# Patient Record
Sex: Male | Born: 1995 | Race: Black or African American | Hispanic: No | Marital: Single
Health system: Midwestern US, Community
[De-identification: ages and names within clinical notes are randomized; demographics above are authoritative.]

## PROBLEM LIST (undated history)

## (undated) DIAGNOSIS — J45909 Unspecified asthma, uncomplicated: Secondary | ICD-10-CM

---

## 2016-09-10 ENCOUNTER — Encounter (HOSPITAL_COMMUNITY): Payer: Self-pay | Admitting: Emergency Medicine

## 2016-09-10 ENCOUNTER — Emergency Department (HOSPITAL_COMMUNITY): Payer: Self-pay

## 2016-09-10 ENCOUNTER — Emergency Department (HOSPITAL_COMMUNITY)
Admission: EM | Admit: 2016-09-10 | Discharge: 2016-09-10 | Disposition: A | Discharge status: Home / Self Care | Payer: Self-pay | Attending: Emergency Medicine | Admitting: Emergency Medicine

## 2016-09-10 DIAGNOSIS — F17200 Nicotine dependence, unspecified, uncomplicated: Secondary | ICD-10-CM | POA: Insufficient documentation

## 2016-09-10 DIAGNOSIS — Z79899 Other long term (current) drug therapy: Secondary | ICD-10-CM | POA: Insufficient documentation

## 2016-09-10 DIAGNOSIS — J9801 Acute bronchospasm: Secondary | ICD-10-CM | POA: Insufficient documentation

## 2016-09-10 HISTORY — DX: Unspecified asthma, uncomplicated: J45.909

## 2016-09-10 LAB — CBC
HEMATOCRIT: 40 % (ref 39.0–52.0)
HEMOGLOBIN: 14.1 g/dL (ref 13.0–17.0)
MCH: 29.6 pg (ref 26.0–34.0)
MCHC: 35.3 g/dL (ref 30.0–36.0)
MCV: 83.9 fL (ref 78.0–100.0)
Platelets: 229 10*3/uL (ref 150–400)
RBC: 4.77 MIL/uL (ref 4.22–5.81)
RDW: 12.8 % (ref 11.5–15.5)
WBC: 7.3 10*3/uL (ref 4.0–10.5)

## 2016-09-10 LAB — BASIC METABOLIC PANEL
ANION GAP: 7 (ref 5–15)
BUN: 6 mg/dL (ref 6–20)
CALCIUM: 9.5 mg/dL (ref 8.9–10.3)
CO2: 27 mmol/L (ref 22–32)
Chloride: 106 mmol/L (ref 101–111)
Creatinine, Ser: 1.1 mg/dL (ref 0.61–1.24)
GFR calc non Af Amer: >60 mL/min (ref >60)
Glucose, Bld: 89 mg/dL (ref 65–99)
POTASSIUM: 3.8 mmol/L (ref 3.5–5.1)
Sodium: 140 mmol/L (ref 135–145)

## 2016-09-10 LAB — I-STAT TROPONIN, ED: TROPONIN I, POC: 0 ng/mL (ref 0.00–0.08)

## 2016-09-10 LAB — D-DIMER, QUANTITATIVE: D-Dimer, Quant: <0.27 ug/mL-FEU (ref 0.00–0.50)

## 2016-09-10 MED ORDER — PREDNISONE 20 MG PO TABS
60.0000 mg | ORAL_TABLET | Freq: Once | ORAL | Status: AC
  Administered 2016-09-10: 60 mg via ORAL
  Filled 2016-09-10: qty 3

## 2016-09-10 MED ORDER — ALBUTEROL SULFATE HFA 108 (90 BASE) MCG/ACT IN AERS
1.0000 | INHALATION_SPRAY | RESPIRATORY_TRACT | Status: DC | PRN
  Administered 2016-09-10: 1 via RESPIRATORY_TRACT
  Filled 2016-09-10: qty 6.7

## 2016-09-10 MED ORDER — KETOROLAC TROMETHAMINE 60 MG/2ML IM SOLN
60.0000 mg | Freq: Once | INTRAMUSCULAR | Status: AC
  Administered 2016-09-10: 60 mg via INTRAMUSCULAR

## 2016-09-10 MED ORDER — AEROCHAMBER PLUS FLO-VU MEDIUM MISC
1.0000 | Freq: Once | Status: AC
  Administered 2016-09-10: 1
  Filled 2016-09-10: qty 1

## 2016-09-10 MED ORDER — PREDNISONE 20 MG PO TABS: 40.0000 mg | ORAL_TABLET | Freq: Every day | ORAL | 0 refills | Status: DC

## 2016-09-10 MED ORDER — IPRATROPIUM-ALBUTEROL 0.5-2.5 (3) MG/3ML IN SOLN
3.0000 mL | Freq: Once | RESPIRATORY_TRACT | Status: AC
  Administered 2016-09-10: 3 mL via RESPIRATORY_TRACT
  Filled 2016-09-10: qty 3

## 2016-09-10 MED ORDER — KETOROLAC TROMETHAMINE 60 MG/2ML IM SOLN
INTRAMUSCULAR | Status: AC
  Filled 2016-09-10: qty 2

## 2016-09-10 NOTE — ED Provider Notes (Signed)
[facility]-EMERGENCY DEPT Provider Note   CSN: [id number] Arrival date & time: 09/10/16  1050     History   Chief Complaint Chief Complaint  Patient presents with  . Cough  . Chest Pain  . Shortness of Breath    HPI Mike Ramirez is a 21 y.o. male.  HPI Mike Ramirez is a 21 y.o. male with PMH significant for asthma who presents with sudden onset, constant, non-radiating, sharp, stabbing, left sided chest pain x 2 days that is worse with deep inspiration with associated shortness of breath.  No fever, chills, cough, syncope, dizziness, lightheadedness, N/V, or abdominal pain.  He has not taken anything for his symptoms.  It is not exertional.  He denies hx of DVT/PE, recent surgery, unilateral leg edema, or exogenous hormone use.  He does note a recent 13 hour bus ride 3 days ago.    Past Medical History:  Diagnosis Date  . Asthma     There are no active problems to display for this patient.   History reviewed. No pertinent surgical history.     Home Medications    Prior to Admission medications   Medication Sig Start Date End Date Taking? Authorizing Provider  albuterol (PROVENTIL HFA;VENTOLIN HFA) 108 (90 Base) MCG/ACT inhaler Inhale 1-2 puffs into the lungs every 6 (six) hours as needed for wheezing or shortness of breath.   Yes Historical Provider, MD  predniSONE (DELTASONE) 20 MG tablet Take 2 tablets (40 mg total) by mouth daily. 09/10/16   Cheri FowlerKayla Presten Joost, PA-C    Family History History reviewed. No pertinent family history.  Social History Social History  Substance Use Topics  . Smoking status: Current Some Day Smoker  . Smokeless tobacco: Never Used  . Alcohol use No     Allergies   Patient has no known allergies.   Review of Systems Review of Systems All other systems negative unless otherwise stated in HPI   Physical Exam Updated Vital Signs BP 139/79 (BP Location: Right Arm)   Pulse 95   Temp 98.4 F (36.9 C) (Oral)   Resp 20   Ht 5\' 9"  (1.753  m)   Wt 77.1 kg   SpO2 98%   BMI 25.10 kg/m   Physical Exam  Constitutional: He is oriented to person, place, and time. He appears well-developed and well-nourished.  Non-toxic appearance. He does not have a sickly appearance. He does not appear ill.  HENT:  Head: Normocephalic and atraumatic.  Mouth/Throat: Oropharynx is clear and moist.  Eyes: Conjunctivae are normal. Pupils are equal, round, and reactive to light.  Neck: Normal range of motion. Neck supple.  Cardiovascular: Normal rate, regular rhythm and normal heart sounds.   Pulmonary/Chest: Effort normal. No accessory muscle usage or stridor. No respiratory distress. He has wheezes. He has no rhonchi. He has no rales. He exhibits no tenderness.  Abdominal: Soft. Bowel sounds are normal. He exhibits no distension. There is no tenderness. There is no rebound and no guarding.  Musculoskeletal: Normal range of motion.  Lymphadenopathy:    He has no cervical adenopathy.  Neurological: He is alert and oriented to person, place, and time.  Speech clear without dysarthria.  Skin: Skin is warm and dry.  Psychiatric: He has a normal mood and affect. His behavior is normal.     ED Treatments / Results  Labs (all labs ordered are listed, but only abnormal results are displayed) Labs Reviewed  BASIC METABOLIC PANEL  CBC  D-DIMER, QUANTITATIVE (NOT [facility])  I-STAT  TROPOININ, ED    EKG  EKG Interpretation  Date/Time:  Tuesday September 10 2016 11:17:03 EST Ventricular Rate:  84 PR Interval:    QRS Duration: 81 QT Interval:  339 QTC Calculation: 401 R Axis:   84 Text Interpretation:  Sinus arrhythmia Baseline wander in lead(s) II No significant change since last tracing Confirmed by Anitra Lauth  MD, Alphonzo Lemmings ([id number]) on 09/10/2016 2:59:49 PM       Radiology Dg Chest 2 View  Result Date: 09/10/2016 CLINICAL DATA:  Cough, congestion, and left-sided chest pain for 2 days. EXAM: CHEST  2 VIEW COMPARISON:  None. FINDINGS: The  cardiomediastinal silhouette is within normal limits. The lungs are well inflated and clear. There is no evidence of pleural effusion or pneumothorax. No acute osseous abnormality is identified. IMPRESSION: No active cardiopulmonary disease. Electronically Signed   By: Sebastian Ache M.D.   On: 09/10/2016 11:55    Procedures Procedures (including critical care time)  Medications Ordered in ED Medications  albuterol (PROVENTIL HFA;VENTOLIN HFA) 108 (90 Base) MCG/ACT inhaler 1-2 puff (1 puff Inhalation Given 09/10/16 1515)  ketorolac (TORADOL) injection 60 mg (60 mg Intramuscular Given 09/10/16 1404)  ipratropium-albuterol (DUONEB) 0.5-2.5 (3) MG/3ML nebulizer solution 3 mL (3 mLs Nebulization Given 09/10/16 1418)  predniSONE (DELTASONE) tablet 60 mg (60 mg Oral Given 09/10/16 1418)  AEROCHAMBER PLUS FLO-VU MEDIUM MISC 1 each (1 each Other Given 09/10/16 1515)     Initial Impression / Assessment and Plan / ED Course  I have reviewed the triage vital signs and the nursing notes.  Pertinent labs & imaging results that were available during my care of the patient were reviewed by me and considered in my medical decision making (see chart for details).     Patient presents with likely bronchospasm.  Vitals stable.  Hx of asthma and similar to prior.  Lungs with faint wheezing.  No respiratory distress.  CP is not exertional.  Not reproducible. EKG without ischemia.  Labs, including dimer and troponin without acute abnormalities.  Single troponin sufficient given 3 day history of constant pain.  CXR clear.  HEAR score 1.  Low suspicion for ACS or dissection.  Likely bronchospasm.  Feels improved after Toradol, prednisone, and duoneb.  Follow up with [facility] given.  Return precautions discussed.  Stable for discharge.   Final Clinical Impressions(s) / ED Diagnoses   Final diagnoses:  Bronchospasm    New Prescriptions New Prescriptions   PREDNISONE (DELTASONE) 20 MG TABLET    Take 2 tablets (40 mg  total) by mouth daily.         Cheri Fowler, PA-C 09/10/16 1522    Gwyneth Sprout, MD 09/11/16 2136

## 2016-09-10 NOTE — ED Notes (Signed)
PT DISCHARGED. INSTRUCTIONS AND PRESCRIPTION GIVEN. AAOX4. PT IN NO APPARENT DISTRESS OR PAIN. THE OPPORTUNITY TO ASK QUESTIONS WAS PROVIDED. 

## 2016-09-10 NOTE — ED Triage Notes (Signed)
Pt c/o cough, sharp lt sided CP, and SOB x 2 days.  No other sx.

## 2016-09-10 NOTE — Discharge Instructions (Signed)
Please take prednisone for the next 4 days and use the inhaler every 4 hours as needed for shortness of breath.  You may take 800 mg ibuprofen for pain.  [facility] to establish a primary care physician.  Return to the ED for any new or concerning symptoms.

## 2016-10-24 ENCOUNTER — Encounter (HOSPITAL_COMMUNITY): Payer: Self-pay | Admitting: Emergency Medicine

## 2016-10-24 ENCOUNTER — Emergency Department (HOSPITAL_COMMUNITY)
Admission: EM | Admit: 2016-10-24 | Discharge: 2016-10-24 | Disposition: A | Discharge status: Home / Self Care | Payer: PRIVATE HEALTH INSURANCE | Attending: Emergency Medicine | Admitting: Emergency Medicine

## 2016-10-24 DIAGNOSIS — J4521 Mild intermittent asthma with (acute) exacerbation: Secondary | ICD-10-CM | POA: Insufficient documentation

## 2016-10-24 DIAGNOSIS — F17200 Nicotine dependence, unspecified, uncomplicated: Secondary | ICD-10-CM | POA: Insufficient documentation

## 2016-10-24 MED ORDER — ALBUTEROL SULFATE HFA 108 (90 BASE) MCG/ACT IN AERS
1.0000 | INHALATION_SPRAY | Freq: Once | RESPIRATORY_TRACT | Status: AC
  Administered 2016-10-24: 1 via RESPIRATORY_TRACT
  Filled 2016-10-24: qty 6.7

## 2016-10-24 NOTE — ED Triage Notes (Signed)
Pt reports hx of asthma states he ran out of his medications, started having productive cough and "bronchospasms" yesterday. Reports some sob. Ambulatory to triage, speaking in full sentences, resp e/u, nad. Lung sounds clear.

## 2016-10-24 NOTE — Discharge Instructions (Signed)
I have refilled your albuterol inhaler.  It is important to have a primary care provider to manage your asthma.  Return to ER for new or worsening symptoms, any additional concerns.

## 2016-10-24 NOTE — ED Provider Notes (Signed)
[facility]-EMERGENCY DEPT Provider Note   CSN: [id number] Arrival date & time: 10/24/16  0102  By signing my name below, I, Majel Homer, attest that this documentation has been prepared under the direction and in the presence of [facility], PA-C . Electronically Signed: Majel Homer, Scribe. 10/24/2016. 10:29 AM.  History   Chief Complaint Chief Complaint  Patient presents with  . Cough  . Asthma   The history is provided by the patient. No language interpreter was used.   HPI Comments: Mike Ramirez is a 21 y.o. male with PMHx of asthma, who presents to the Emergency Department complaining of persistent, shortness of breath consistent with his exacerbations of asthma that began yesterday. Pt reports associated cough productive of "a little mucous" that also began yesterday. He states he normally uses his albuterol inhaler every day to treat his asthma but notes he ran out of this medication ~1 week ago. He denies any nasal congestion, chest pain, sore throat or fever/chills.    Past Medical History:  Diagnosis Date  . Asthma    There are no active problems to display for this patient.  History reviewed. No pertinent surgical history.  Home Medications    Prior to Admission medications   Medication Sig Start Date End Date Taking? Authorizing Provider  albuterol (PROVENTIL HFA;VENTOLIN HFA) 108 (90 Base) MCG/ACT inhaler Inhale 1-2 puffs into the lungs every 6 (six) hours as needed for wheezing or shortness of breath.    Historical Provider, MD  predniSONE (DELTASONE) 20 MG tablet Take 2 tablets (40 mg total) by mouth daily. 09/10/16   Cheri Fowler, PA-C    Family History No family history on file.  Social History Social History  Substance Use Topics  . Smoking status: Current Some Day Smoker  . Smokeless tobacco: Never Used  . Alcohol use No   Allergies   Patient has no known allergies.  Review of Systems Review of Systems  Constitutional: Negative for chills and fever.  HENT:  Negative for congestion and sore throat.   Respiratory: Positive for cough and shortness of breath.   Cardiovascular: Negative for chest pain.   Physical Exam Updated Vital Signs BP 145/62   Pulse 97   Temp 97.7 F (36.5 C) (Oral)   Resp 15   SpO2 100%   Physical Exam  Constitutional: He is oriented to person, place, and time. He appears well-developed and well-nourished. No distress.  HENT:  Head: Normocephalic and atraumatic.  Neck: Normal range of motion. Neck supple.  Cardiovascular: Normal rate, regular rhythm and normal heart sounds.   Pulmonary/Chest: Effort normal and breath sounds normal.  Lungs clear to auscultation bilaterally - no wheezes, rales or rhonchi. Speaking in full sentences without difficulty.  Abdominal: He exhibits distension.  Musculoskeletal: Normal range of motion.  Neurological: He is alert and oriented to person, place, and time.  Psychiatric: He has a normal mood and affect.  Nursing note and vitals reviewed.  ED Treatments / Results  DIAGNOSTIC STUDIES:  Oxygen Saturation is 100% on RA, normal by my interpretation.    COORDINATION OF CARE:  10:16 AM Discussed treatment plan with pt at bedside and pt agreed to plan.  Labs (all labs ordered are listed, but only abnormal results are displayed) Labs Reviewed - No data to display  EKG  EKG Interpretation None       Radiology No results found.  Procedures Procedures (including critical care time)  Medications Ordered in ED Medications  albuterol (PROVENTIL HFA;VENTOLIN HFA) 108 (90  Base) MCG/ACT inhaler 1-2 puff (1 puff Inhalation Given 10/24/16 1028)    Initial Impression / Assessment and Plan / ED Course  I have reviewed the triage vital signs and the nursing notes.  Pertinent labs & imaging results that were available during my care of the patient were reviewed by me and considered in my medical decision making (see chart for details).     Mike Ramirez is a 21 y.o. male who  presents to ED for intermittent shortness of breath c/w asthma sxs. He typically uses his albuterol inhaler daily for his asthma, however has run out and is requesting albuterol inhaler refill. On exam, patient is afebrile, well-appearing and speaking in full sentences without difficulty. O2 sats of 100% on room air. Lungs are clear to auscultation with no wheezes, rales or rhonchi. Albuterol inhaler provided. Strongly encouraged patient to find a primary care provider in the area. He states that once he starts his new job and has insurance he is going to do so. Reasons to return to the ER were discussed and all questions answered.  I personally performed the services described in this documentation, which was scribed in my presence. The recorded information has been reviewed and is accurate.   Final Clinical Impressions(s) / ED Diagnoses   Final diagnoses:  Mild intermittent asthma with exacerbation    New Prescriptions Discharge Medication List as of 10/24/2016 10:20 AM       Chase Picket Fatisha Rabalais, PA-C 10/24/16 1029    Vanetta Mulders, MD 10/25/16 1104

## 2016-11-20 ENCOUNTER — Encounter (HOSPITAL_COMMUNITY): Payer: Self-pay

## 2016-11-20 ENCOUNTER — Emergency Department (HOSPITAL_COMMUNITY): Payer: PRIVATE HEALTH INSURANCE

## 2016-11-20 ENCOUNTER — Emergency Department (HOSPITAL_COMMUNITY)
Admission: EM | Admit: 2016-11-20 | Discharge: 2016-11-20 | Disposition: A | Discharge status: Home / Self Care | Payer: PRIVATE HEALTH INSURANCE | Attending: Emergency Medicine | Admitting: Emergency Medicine

## 2016-11-20 DIAGNOSIS — F17200 Nicotine dependence, unspecified, uncomplicated: Secondary | ICD-10-CM | POA: Insufficient documentation

## 2016-11-20 DIAGNOSIS — J4541 Moderate persistent asthma with (acute) exacerbation: Secondary | ICD-10-CM | POA: Insufficient documentation

## 2016-11-20 LAB — BASIC METABOLIC PANEL
ANION GAP: 11 (ref 5–15)
BUN: 11 mg/dL (ref 6–20)
CALCIUM: 9.7 mg/dL (ref 8.9–10.3)
CO2: 22 mmol/L (ref 22–32)
CREATININE: 1.24 mg/dL (ref 0.61–1.24)
Chloride: 106 mmol/L (ref 101–111)
GFR calc Af Amer: >60 mL/min (ref >60)
GFR calc non Af Amer: >60 mL/min (ref >60)
GLUCOSE: 92 mg/dL (ref 65–99)
Potassium: 3.8 mmol/L (ref 3.5–5.1)
Sodium: 139 mmol/L (ref 135–145)

## 2016-11-20 LAB — CBC
HCT: 39.7 % (ref 39.0–52.0)
HEMOGLOBIN: 14 g/dL (ref 13.0–17.0)
MCH: 30.2 pg (ref 26.0–34.0)
MCHC: 35.3 g/dL (ref 30.0–36.0)
MCV: 85.7 fL (ref 78.0–100.0)
Platelets: 269 10*3/uL (ref 150–400)
RBC: 4.63 MIL/uL (ref 4.22–5.81)
RDW: 13.6 % (ref 11.5–15.5)
WBC: 9.5 10*3/uL (ref 4.0–10.5)

## 2016-11-20 LAB — I-STAT TROPONIN, ED: TROPONIN I, POC: 0 ng/mL (ref 0.00–0.08)

## 2016-11-20 MED ORDER — PREDNISONE 20 MG PO TABS
60.0000 mg | ORAL_TABLET | Freq: Once | ORAL | Status: AC
  Administered 2016-11-20: 60 mg via ORAL
  Filled 2016-11-20: qty 3

## 2016-11-20 MED ORDER — PREDNISONE 10 MG PO TABS: 20.0000 mg | ORAL_TABLET | Freq: Every day | ORAL | 0 refills | Status: DC

## 2016-11-20 MED ORDER — ALBUTEROL SULFATE HFA 108 (90 BASE) MCG/ACT IN AERS
2.0000 | INHALATION_SPRAY | RESPIRATORY_TRACT | Status: DC | PRN
  Administered 2016-11-20: 2 via RESPIRATORY_TRACT
  Filled 2016-11-20: qty 6.7

## 2016-11-20 NOTE — ED Notes (Signed)
Patient left at this time with all belongings. 

## 2016-11-20 NOTE — ED Provider Notes (Signed)
[facility]-EMERGENCY DEPT Provider Note   CSN: [id number] Arrival date & time: 11/20/16  2109     History   Chief Complaint Chief Complaint  Patient presents with  . Asthma  . Chest Pain    HPI Mike Ramirez is a 21 y.o. male.  This a 22 year old male with a history of asthma.  He's had to use his inhaler more frequently over the last several weeks, up to 8 times a day.  He is currently out of his inhaler.  He has no primary care physician. Denies any fever, nonproductive cough.      Past Medical History:  Diagnosis Date  . Asthma     There are no active problems to display for this patient.   History reviewed. No pertinent surgical history.     Home Medications    Prior to Admission medications   Medication Sig Start Date End Date Taking? Authorizing Provider  albuterol (PROVENTIL HFA;VENTOLIN HFA) 108 (90 Base) MCG/ACT inhaler Inhale 1-2 puffs into the lungs every 6 (six) hours as needed for wheezing or shortness of breath.    Historical Provider, MD  predniSONE (DELTASONE) 10 MG tablet Take 2 tablets (20 mg total) by mouth daily. 11/20/16   Earley Favor, NP  predniSONE (DELTASONE) 20 MG tablet Take 2 tablets (40 mg total) by mouth daily. 09/10/16   Cheri Fowler, PA-C    Family History No family history on file.  Social History Social History  Substance Use Topics  . Smoking status: Current Some Day Smoker  . Smokeless tobacco: Never Used  . Alcohol use No     Allergies   Patient has no known allergies.   Review of Systems Review of Systems  Constitutional: Negative for fever.  HENT: Negative for congestion and rhinorrhea.   Respiratory: Positive for shortness of breath and wheezing. Negative for cough.   Cardiovascular: Positive for chest pain.  All other systems reviewed and are negative.    Physical Exam Updated Vital Signs BP 128/75 (BP Location: Right Arm)   Pulse (!) 102   Temp 98 F (36.7 C) (Oral)   Resp 18   Ht  (1.727 m)   Wt  81.6 kg   SpO2 99%   BMI 27.37 kg/m   Physical Exam  Constitutional: He appears well-developed and well-nourished.  HENT:  Head: Normocephalic.  Eyes: Pupils are equal, round, and reactive to light.  Neck: Normal range of motion.  Cardiovascular: Normal rate.   Pulmonary/Chest: Effort normal. He has wheezes. He exhibits no tenderness.  Slight end expiratory wheeze  Abdominal: Soft.  Musculoskeletal: Normal range of motion.  Neurological: He is alert.  Skin: Skin is warm.  Psychiatric: He has a normal mood and affect.  Nursing note and vitals reviewed.    ED Treatments / Results  Labs (all labs ordered are listed, but only abnormal results are displayed) Labs Reviewed  BASIC METABOLIC PANEL  CBC  I-STAT TROPOININ, ED    EKG  EKG Interpretation None       Radiology Dg Chest 2 View  Result Date: 11/20/2016 CLINICAL DATA:  21 y/o  M; chest pain. EXAM: CHEST  2 VIEW COMPARISON:  09/10/2016 chest radiograph FINDINGS: Stable heart size and mediastinal contours are within normal limits. Both lungs are clear. The visualized skeletal structures are unremarkable. IMPRESSION: No active cardiopulmonary disease. Electronically Signed   By: Mitzi Hansen M.D.   On: 11/20/2016 22:01    Procedures Procedures (including critical care time)  Medications Ordered in ED  Medications  predniSONE (DELTASONE) tablet 60 mg (not administered)  albuterol (PROVENTIL HFA;VENTOLIN HFA) 108 (90 Base) MCG/ACT inhaler 2 puff (not administered)     Initial Impression / Assessment and Plan / ED Course  I have reviewed the triage vital signs and the nursing notes.  Pertinent labs & imaging results that were available during my care of the patient were reviewed by me and considered in my medical decision making (see chart for details).      Discussed with the patient the importance of a primary care physician is a think his asthma has been from mild to intermediate, he will be  given a short course of steroids will be started in the emergency department, as well as an inhaler and referral to community wellness.  He's been instructed and informed of signs and symptoms to bring him back to the emergency department immediately.  Final Clinical Impressions(s) / ED Diagnoses   Final diagnoses:  Moderate persistent asthma with exacerbation    New Prescriptions New Prescriptions   PREDNISONE (DELTASONE) 10 MG TABLET    Take 2 tablets (20 mg total) by mouth daily.     Earley Favor, NP 11/20/16 2312    Earley Favor, NP 11/20/16 2313    Lavera Guise, MD 11/22/16 [phone]

## 2016-11-20 NOTE — ED Triage Notes (Signed)
Pt states that he has asthma ans he is out of his albuterol, becomes more SOB when he starts working. Pt reports non productive cough and denies fevers. Pt also reports constant CP for the past few days, L sided, denies n/v

## 2016-11-20 NOTE — Discharge Instructions (Signed)
As discussed.  It is very important to establish primary medical care.  You have been given a referral to community wellness.  Please all to set an appointment Been started on a short course of steroids, i.e., prednisone.  Please use until all tablets have been completed.  You've also been provided with an inhaler.  Try not to use it more than 2 puffs every 4 hours As discussed if you need to use her inhaler more frequently if your shortness of breath becomes worse despite the use of your inhaler, you need to come back to the emergency department for further evaluation. Also be helpful prior to your appointment to keep track of how often you're using your inhaler so they can better ascertain the level of asthma, your this time.

## 2016-12-13 ENCOUNTER — Encounter (HOSPITAL_COMMUNITY): Payer: Self-pay | Admitting: Emergency Medicine

## 2016-12-13 ENCOUNTER — Emergency Department (HOSPITAL_COMMUNITY)
Admission: EM | Admit: 2016-12-13 | Discharge: 2016-12-13 | Disposition: A | Discharge status: Home / Self Care | Payer: PRIVATE HEALTH INSURANCE | Attending: Emergency Medicine | Admitting: Emergency Medicine

## 2016-12-13 DIAGNOSIS — F17210 Nicotine dependence, cigarettes, uncomplicated: Secondary | ICD-10-CM | POA: Insufficient documentation

## 2016-12-13 DIAGNOSIS — J4541 Moderate persistent asthma with (acute) exacerbation: Secondary | ICD-10-CM | POA: Insufficient documentation

## 2016-12-13 MED ORDER — PREDNISONE 50 MG PO TABS: 50.0000 mg | ORAL_TABLET | Freq: Every day | ORAL | 0 refills | Status: AC

## 2016-12-13 MED ORDER — CETIRIZINE HCL 10 MG PO TABS: 10.0000 mg | ORAL_TABLET | Freq: Every day | ORAL | 0 refills | Status: DC

## 2016-12-13 MED ORDER — ALBUTEROL SULFATE (2.5 MG/3ML) 0.083% IN NEBU
5.0000 mg | INHALATION_SOLUTION | Freq: Once | RESPIRATORY_TRACT | Status: AC
  Administered 2016-12-13: 5 mg via RESPIRATORY_TRACT
  Filled 2016-12-13: qty 6

## 2016-12-13 MED ORDER — PREDNISONE 20 MG PO TABS
60.0000 mg | ORAL_TABLET | Freq: Once | ORAL | Status: AC
  Administered 2016-12-13: 60 mg via ORAL
  Filled 2016-12-13: qty 3

## 2016-12-13 MED ORDER — ALBUTEROL SULFATE HFA 108 (90 BASE) MCG/ACT IN AERS: 1.0000 | INHALATION_SPRAY | Freq: Four times a day (QID) | RESPIRATORY_TRACT | 0 refills | Status: DC | PRN

## 2016-12-13 NOTE — Discharge Instructions (Signed)
Medications: Zyrtec, prednisone, albuterol inhaler  Treatment: Take Zyrtec once daily for allergy symptoms as cause of potential asthma exacerbations. Take prednisone once daily for 4 more days. Use albuterol inhaler every 4-6 hours as needed for wheezing or shortness of breath.  Follow-up: Please follow-up at your scheduled appointment next week to establish care with a primary care provider. Please return to the emergency department if you develop any new or worsening symptoms.

## 2016-12-13 NOTE — ED Notes (Signed)
ED Provider at bedside. 

## 2016-12-13 NOTE — ED Provider Notes (Signed)
[facility]-EMERGENCY DEPT Provider Note   CSN: [id number] Arrival date & time: 12/13/16  0759     History   Chief Complaint Chief Complaint  Patient presents with  . Asthma    HPI Mike Ramirez is a 21 y.o. male with history of asthma who presents with shortness of breath and wheezing. Patient states he has had to use his inhaler 5-6 times a day recently. He has associated cough when he needs to use his inhaler. Patient denies any chest pain. He denies any fevers, abdominal pain, nausea and vomiting. Patient has an appointment to establish care with a primary care provider on 12/20/2016.  HPI  Past Medical History:  Diagnosis Date  . Asthma     There are no active problems to display for this patient.   History reviewed. No pertinent surgical history.     Home Medications    Prior to Admission medications   Medication Sig Start Date End Date Taking? Authorizing Provider  albuterol (PROVENTIL HFA;VENTOLIN HFA) 108 (90 Base) MCG/ACT inhaler Inhale 1-2 puffs into the lungs every 6 (six) hours as needed for wheezing or shortness of breath. 12/13/16   Emi HolesAlexandra M Kaity Pitstick, PA-C  cetirizine (ZYRTEC ALLERGY) 10 MG tablet Take 1 tablet (10 mg total) by mouth daily. 12/13/16   Emi HolesAlexandra M Neil Brickell, PA-C  predniSONE (DELTASONE) 50 MG tablet Take 1 tablet (50 mg total) by mouth daily. 12/13/16   Emi HolesAlexandra M Rooney Swails, PA-C    Family History No family history on file.  Social History Social History  Substance Use Topics  . Smoking status: Current Every Day Smoker    Packs/day: 0.20    Types: Cigarettes, Cigars  . Smokeless tobacco: Never Used  . Alcohol use No     Allergies   Patient has no known allergies.   Review of Systems Review of Systems  Constitutional: Negative for fever.  HENT: Negative for facial swelling and sore throat.   Respiratory: Positive for cough, shortness of breath and wheezing.   Cardiovascular: Negative for chest pain.  Gastrointestinal: Negative for abdominal pain,  nausea and vomiting.  Skin: Negative for rash and wound.     Physical Exam Updated Vital Signs BP (!) 147/79   Pulse 92   Temp 98.2 F (36.8 C) (Oral)   Resp 18   Ht 5\' 8"  (1.727 m)   Wt 81.6 kg   SpO2 98%   BMI 27.37 kg/m   Physical Exam  Constitutional: He appears well-developed and well-nourished. No distress.  HENT:  Head: Normocephalic and atraumatic.  Mouth/Throat: Oropharynx is clear and moist. No oropharyngeal exudate.  Eyes: Conjunctivae are normal. Pupils are equal, round, and reactive to light. Right eye exhibits no discharge. Left eye exhibits no discharge. No scleral icterus.  Neck: Normal range of motion. Neck supple. No thyromegaly present.  Cardiovascular: Normal rate, regular rhythm, normal heart sounds and intact distal pulses.  Exam reveals no gallop and no friction rub.   No murmur heard. Pulmonary/Chest: Effort normal. No stridor. No respiratory distress. He has wheezes (expiratory, bilaterally). He has no rales.  Abdominal: Soft. Bowel sounds are normal. He exhibits no distension. There is no tenderness. There is no rebound and no guarding.  Musculoskeletal: He exhibits no edema.  Lymphadenopathy:    He has no cervical adenopathy.  Neurological: He is alert. Coordination normal.  Skin: Skin is warm and dry. No rash noted. He is not diaphoretic. No pallor.  Psychiatric: He has a normal mood and affect.  Nursing note and vitals  reviewed.    ED Treatments / Results  Labs (all labs ordered are listed, but only abnormal results are displayed) Labs Reviewed - No data to display  EKG  EKG Interpretation None       Radiology No results found.  Procedures Procedures (including critical care time)  Medications Ordered in ED Medications  albuterol (PROVENTIL) (2.5 MG/3ML) 0.083% nebulizer solution 5 mg (5 mg Nebulization Given 12/13/16 0857)  predniSONE (DELTASONE) tablet 60 mg (60 mg Oral Given 12/13/16 0934)     Initial Impression / Assessment  and Plan / ED Course  I have reviewed the triage vital signs and the nursing notes.  Pertinent labs & imaging results that were available during my care of the patient were reviewed by me and considered in my medical decision making (see chart for details).     Patient ambulated in ED with O2 saturations maintained >98, no current signs of respiratory distress. Lung exam improved after nebulizer treatment, wheezing resolved. Prednisone given in the ED and pt will bd dc with 5 day burst. Pt states they are breathing at baseline. Will initiate Zyrtec for allergy symptoms contributing to asthma exacerbations. Patient has appointment to follow up and establish care with PCP next week. Patient given inhaler refill. Return precautions discussed. Patient understands and agrees with plan. Patient vitals stable and discharged in satisfactory condition.  Final Clinical Impressions(s) / ED Diagnoses   Final diagnoses:  Moderate persistent asthma with exacerbation    New Prescriptions New Prescriptions   CETIRIZINE (ZYRTEC ALLERGY) 10 MG TABLET    Take 1 tablet (10 mg total) by mouth daily.     Emi Holes, PA-C 12/13/16 1610    Gerhard Munch, MD 12/17/16 [phone]

## 2016-12-13 NOTE — ED Notes (Signed)
Patient ambulating with pulse ox. Patient o2 98% HR 115 patient denies any Dizziness, Chest pain, SOB.

## 2016-12-13 NOTE — ED Triage Notes (Signed)
Pt reports ongoing wheezing with dry cough, states uses albuterol inhaler 5-6 times a day.  Pt reports being out of an inhaler at this time.

## 2016-12-20 ENCOUNTER — Ambulatory Visit (INDEPENDENT_AMBULATORY_CARE_PROVIDER_SITE_OTHER): Payer: Self-pay | Admitting: Physician Assistant

## 2016-12-20 ENCOUNTER — Encounter (INDEPENDENT_AMBULATORY_CARE_PROVIDER_SITE_OTHER): Payer: Self-pay | Admitting: Physician Assistant

## 2016-12-20 VITALS — BP 124/71 | HR 82 | Temp 97.9°F | Ht 70.0 in | Wt 190.0 lb

## 2016-12-20 DIAGNOSIS — J4521 Mild intermittent asthma with (acute) exacerbation: Secondary | ICD-10-CM

## 2016-12-20 MED ORDER — BUDESONIDE-FORMOTEROL FUMARATE 80-4.5 MCG/ACT IN AERO: 2.0000 | INHALATION_SPRAY | Freq: Two times a day (BID) | RESPIRATORY_TRACT | 3 refills | Status: AC

## 2016-12-20 MED ORDER — ALBUTEROL SULFATE HFA 108 (90 BASE) MCG/ACT IN AERS: 1.0000 | INHALATION_SPRAY | Freq: Four times a day (QID) | RESPIRATORY_TRACT | 0 refills | Status: DC | PRN

## 2016-12-20 MED ORDER — CETIRIZINE HCL 10 MG PO TABS: 10.0000 mg | ORAL_TABLET | Freq: Every day | ORAL | 0 refills | Status: AC

## 2016-12-20 NOTE — Progress Notes (Signed)
Subjective:  Patient ID: Mike Ramirez, male    DOB: 05/16/1996  Age: 21 y.o. MRN: [id number]  CC: asthma  HPI Mike Ramirez is a 21 y.o. male with a PMH of asthma presents on f/u of ED visit on 12/13/16, 11/20/16, and 10/24/16 for asthma. Has been using Albuterol few times per week. Has to use every 4-6 hours when asthma exacerbation occurs. Currently also taking Prednisone 50 mg and cetirizine. Reports asthma during spring and winter month. Also, sometimes when exercising. Does not endorse any other symptoms.       Outpatient Medications Prior to Visit  Medication Sig Dispense Refill  . predniSONE (DELTASONE) 50 MG tablet Take 1 tablet (50 mg total) by mouth daily. 4 tablet 0  . albuterol (PROVENTIL HFA;VENTOLIN HFA) 108 (90 Base) MCG/ACT inhaler Inhale 1-2 puffs into the lungs every 6 (six) hours as needed for wheezing or shortness of breath. 1 Inhaler 0  . cetirizine (ZYRTEC ALLERGY) 10 MG tablet Take 1 tablet (10 mg total) by mouth daily. 30 tablet 0   No facility-administered medications prior to visit.      ROS Review of Systems  Constitutional: Negative for chills, fever and malaise/fatigue.  Eyes: Negative for blurred vision.  Respiratory: Positive for shortness of breath. Negative for cough and wheezing.   Cardiovascular: Negative for chest pain and palpitations.  Gastrointestinal: Negative for abdominal pain and nausea.  Genitourinary: Negative for dysuria and hematuria.  Musculoskeletal: Negative for joint pain and myalgias.  Skin: Negative for rash.  Neurological: Negative for tingling and headaches.  Psychiatric/Behavioral: Negative for depression. The patient is not nervous/anxious.     Objective:  BP 124/71 (BP Location: Left Arm, Patient Position: Sitting, Cuff Size: Large)   Pulse 82   Temp 97.9 F (36.6 C) (Oral)   Ht 5\' 10"  (1.778 m)   Wt 190 lb (86.2 kg)   SpO2 98%   BMI 27.26 kg/m   BP/Weight 12/20/2016 12/13/2016 11/20/2016  Systolic BP 124 147 122   Diastolic BP 71 79 82  Wt. (Lbs) 190 180 180  BMI 27.26 27.37 27.37      Physical Exam  Constitutional: He is oriented to person, place, and time.  Well developed, well nourished, NAD, polite  HENT:  Head: Normocephalic and atraumatic.  Eyes: No scleral icterus.  Cardiovascular: Normal rate, regular rhythm and normal heart sounds.   Pulmonary/Chest: Effort normal and breath sounds normal.  Musculoskeletal: He exhibits no edema.  Neurological: He is alert and oriented to person, place, and time. No cranial nerve deficit. Coordination normal.  Skin: Skin is warm and dry. No rash noted. No erythema. No pallor.  Psychiatric: He has a normal mood and affect. His behavior is normal. Thought content normal.  Vitals reviewed.    Assessment & Plan:   1. Mild intermittent asthma with acute exacerbation - Begin budesonide-formoterol (SYMBICORT) 80-4.5 MCG/ACT inhaler; Inhale 2 puffs into the lungs 2 (two) times daily.  Dispense: 1 Inhaler; Refill: 3 - Refill albuterol (PROVENTIL HFA;VENTOLIN HFA) 108 (90 Base) MCG/ACT inhaler; Inhale 1-2 puffs into the lungs every 6 (six) hours as needed for wheezing or shortness of breath.  Dispense: 1 Inhaler; Refill: 0 - Refill cetirizine (ZYRTEC ALLERGY) 10 MG tablet; Take 1 tablet (10 mg total) by mouth daily.  Dispense: 30 tablet; Refill: 0   Meds ordered this encounter  Medications  . budesonide-formoterol (SYMBICORT) 80-4.5 MCG/ACT inhaler    Sig: Inhale 2 puffs into the lungs 2 (two) times daily.    Dispense:  1 Inhaler    Refill:  3    Order Specific Question:   Supervising Provider    Answer:   Quentin AngstJEGEDE, OLUGBEMIGA E [id number]]  . albuterol (PROVENTIL HFA;VENTOLIN HFA) 108 (90 Base) MCG/ACT inhaler    Sig: Inhale 1-2 puffs into the lungs every 6 (six) hours as needed for wheezing or shortness of breath.    Dispense:  1 Inhaler    Refill:  0    Order Specific Question:   Supervising Provider    Answer:   Quentin AngstJEGEDE, OLUGBEMIGA E [id number]]  .  cetirizine (ZYRTEC ALLERGY) 10 MG tablet    Sig: Take 1 tablet (10 mg total) by mouth daily.    Dispense:  30 tablet    Refill:  0    Order Specific Question:   Supervising Provider    Answer:   Quentin AngstJEGEDE, OLUGBEMIGA E [[id number]]    Follow-up: Return in about 4 weeks (around 01/17/2017) for full physical.   Loletta Specteroger David Rosaleigh Brazzel PA

## 2016-12-20 NOTE — Patient Instructions (Signed)

## 2017-01-01 ENCOUNTER — Ambulatory Visit (INDEPENDENT_AMBULATORY_CARE_PROVIDER_SITE_OTHER): Payer: Self-pay | Admitting: Physician Assistant

## 2017-01-01 ENCOUNTER — Encounter (INDEPENDENT_AMBULATORY_CARE_PROVIDER_SITE_OTHER): Payer: Self-pay | Admitting: Physician Assistant

## 2017-01-01 VITALS — BP 121/69 | HR 83 | Temp 99.0°F | Wt 191.0 lb

## 2017-01-01 DIAGNOSIS — J4520 Mild intermittent asthma, uncomplicated: Secondary | ICD-10-CM

## 2017-01-01 MED FILL — **SYMBICORT 80-4.5 MCG INHA: 80-4.5 | 15 days supply | Qty: 1 | Fill #0

## 2017-01-01 NOTE — Patient Instructions (Signed)

## 2017-01-01 NOTE — Progress Notes (Signed)
  Subjective:  Patient ID: Mike Ramirez, male    DOB: 10/13/1995  Age: 21 y.o. MRN: [id number]  CC: question about inhalers  HPI Mike Ramirez is a 21 y.o. male with a PMH of asthma presents with question about how to afford inhalers. Says he could not remember what I had told him about [facility]'s program for affordable medicines. Has not filled his inhaler rx for Symbicort, Albuterol, or cetirizine. Thinks he will need them soon because the effect of the prednisone is wearing off.    Outpatient Medications Prior to Visit  Medication Sig Dispense Refill  . albuterol (PROVENTIL HFA;VENTOLIN HFA) 108 (90 Base) MCG/ACT inhaler Inhale 1-2 puffs into the lungs every 6 (six) hours as needed for wheezing or shortness of breath. 1 Inhaler 0  . budesonide-formoterol (SYMBICORT) 80-4.5 MCG/ACT inhaler Inhale 2 puffs into the lungs 2 (two) times daily. 1 Inhaler 3  . cetirizine (ZYRTEC ALLERGY) 10 MG tablet Take 1 tablet (10 mg total) by mouth daily. 30 tablet 0  . predniSONE (DELTASONE) 50 MG tablet Take 1 tablet (50 mg total) by mouth daily. 4 tablet 0   No facility-administered medications prior to visit.      ROS ROS  Objective:  BP 121/69 (BP Location: Left Arm, Patient Position: Sitting, Cuff Size: Large)   Pulse 83   Temp 99 F (37.2 C) (Oral)   Wt 191 lb (86.6 kg)   SpO2 97%   BMI 27.41 kg/m   BP/Weight 01/01/2017 12/20/2016 12/13/2016  Systolic BP 121 124 147  Diastolic BP 69 71 79  Wt. (Lbs) 191 190 180  BMI 27.41 27.26 27.37      Physical Exam   Assessment & Plan:   1. Mild intermittent asthma, unspecified whether complicated - Pt advised to go to [facility] and ask to pay in installments. He can also ask if there is any manufacturer coupon or program for free medicines.      Follow-up: Return if symptoms worsen or fail to improve.   Loletta Specteroger David Gomez PA

## 2017-01-09 ENCOUNTER — Ambulatory Visit: Payer: Self-pay | Attending: Physician Assistant

## 2017-01-17 MED FILL — **SYMBICORT 80-4.5 MCG INHA: 80-4.5 | 15 days supply | Qty: 1 | Fill #1

## 2017-02-10 ENCOUNTER — Other Ambulatory Visit: Payer: Self-pay | Admitting: *Deleted

## 2017-02-10 ENCOUNTER — Other Ambulatory Visit (INDEPENDENT_AMBULATORY_CARE_PROVIDER_SITE_OTHER): Payer: Self-pay | Admitting: Physician Assistant

## 2017-02-10 DIAGNOSIS — J45909 Unspecified asthma, uncomplicated: Secondary | ICD-10-CM

## 2017-02-10 DIAGNOSIS — J4521 Mild intermittent asthma with (acute) exacerbation: Secondary | ICD-10-CM

## 2017-02-10 MED ORDER — FLUTICASONE-SALMETEROL 115-21 MCG/ACT IN AERO: 2.0000 | INHALATION_SPRAY | Freq: Two times a day (BID) | RESPIRATORY_TRACT | 12 refills | Status: DC

## 2017-02-10 MED ORDER — ALBUTEROL SULFATE HFA 108 (90 BASE) MCG/ACT IN AERS: 1.0000 | INHALATION_SPRAY | Freq: Four times a day (QID) | RESPIRATORY_TRACT | 3 refills | Status: AC | PRN

## 2017-02-10 NOTE — Telephone Encounter (Signed)
I have sent Advair HFA as requested.

## 2017-02-10 NOTE — Telephone Encounter (Signed)
PRINTED FOR PASS PROGRAM 

## 2017-02-18 ENCOUNTER — Other Ambulatory Visit: Payer: Self-pay | Admitting: *Deleted

## 2017-02-18 DIAGNOSIS — J45909 Unspecified asthma, uncomplicated: Secondary | ICD-10-CM

## 2017-02-18 MED ORDER — FLUTICASONE-SALMETEROL 115-21 MCG/ACT IN AERO: 2.0000 | INHALATION_SPRAY | Freq: Two times a day (BID) | RESPIRATORY_TRACT | 3 refills | Status: AC

## 2017-02-18 NOTE — Telephone Encounter (Signed)
PRINTED FOR PASS PROGRAM 

## 2018-11-02 IMAGING — DX DG CHEST 2V
2 series · 2 of 2 positions shown · non-contrast
Comparison: 09/10/2016 chest radiograph

CLINICAL DATA: 20 y/o  M; chest pain.

EXAM:
CHEST  2 VIEW

[w chest pa]
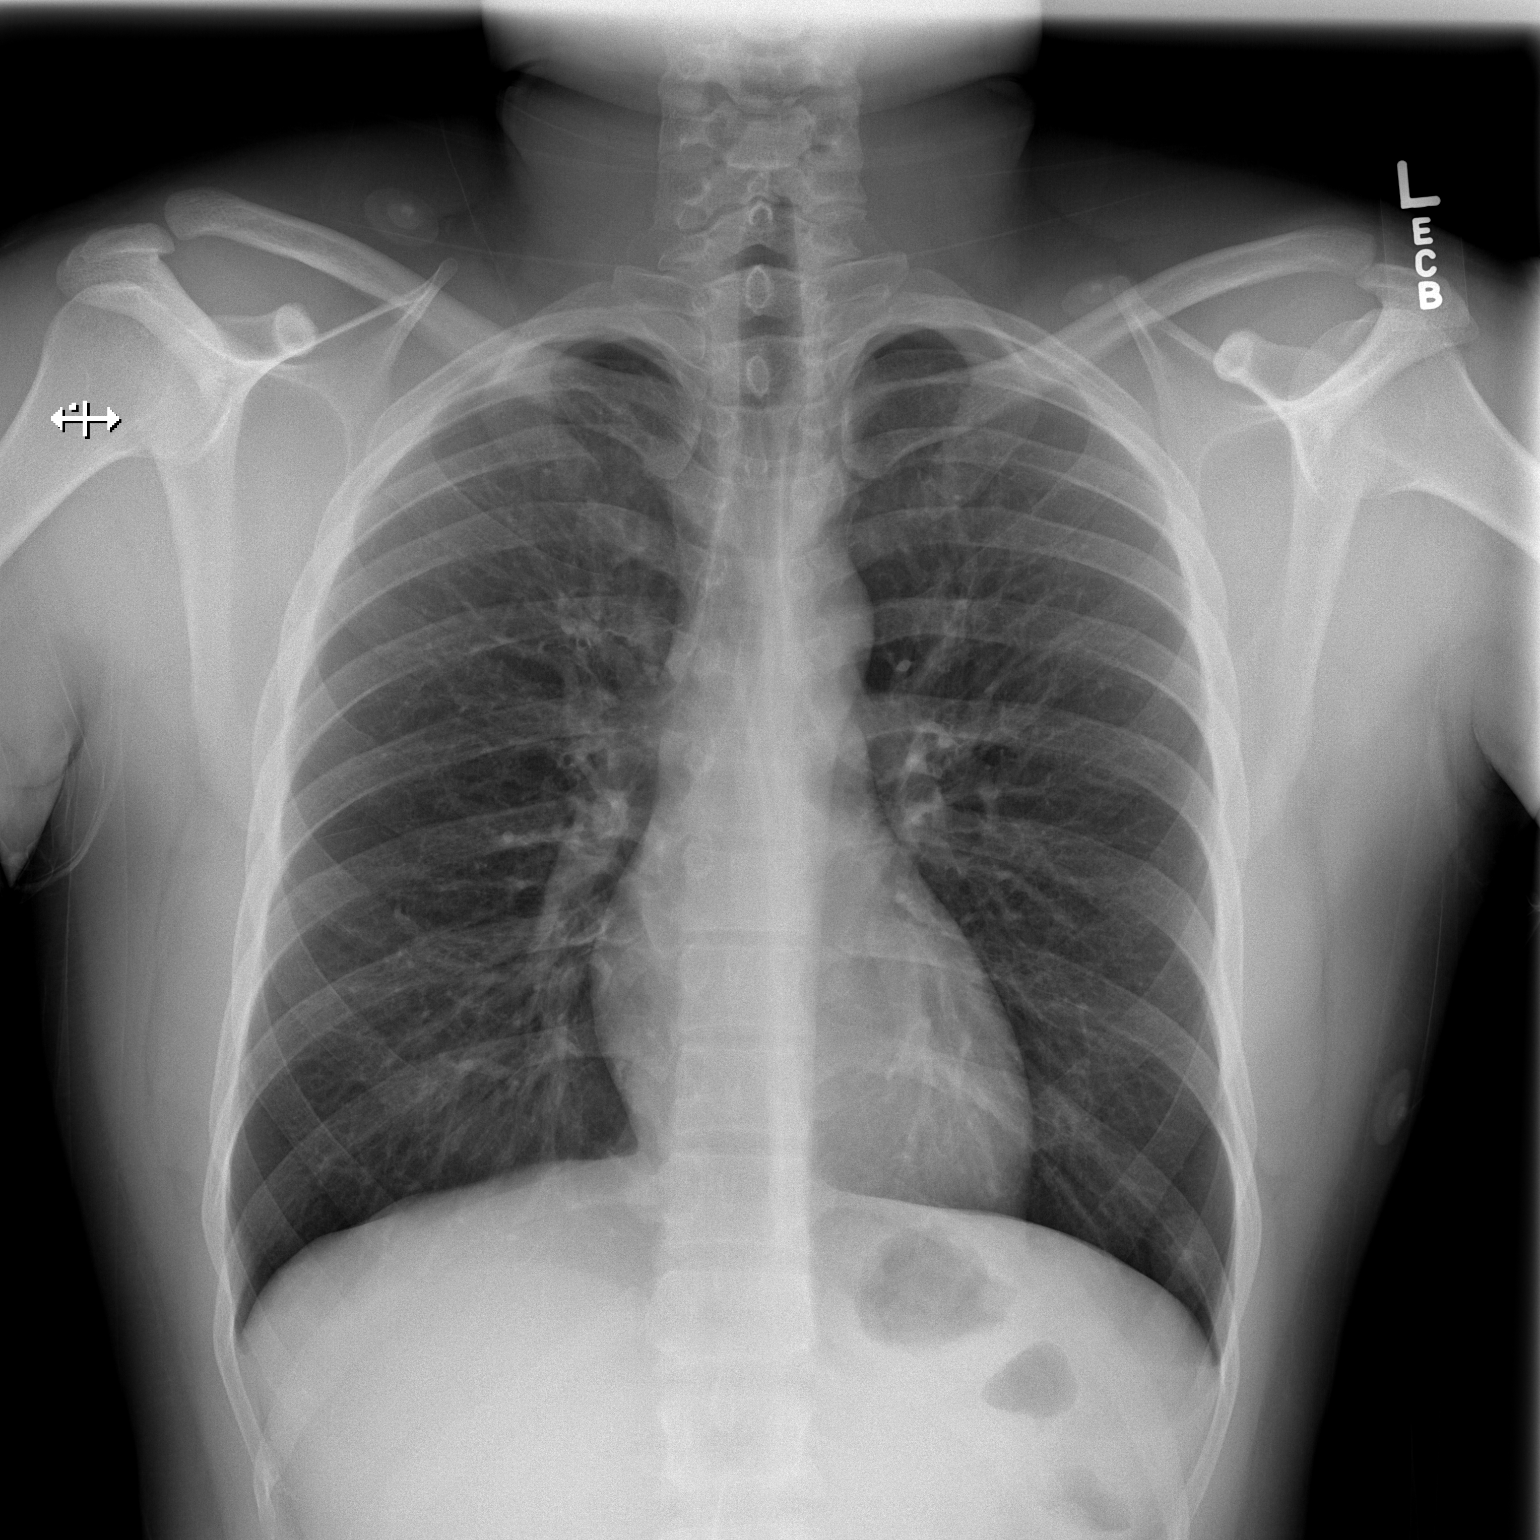

[w chest lat]
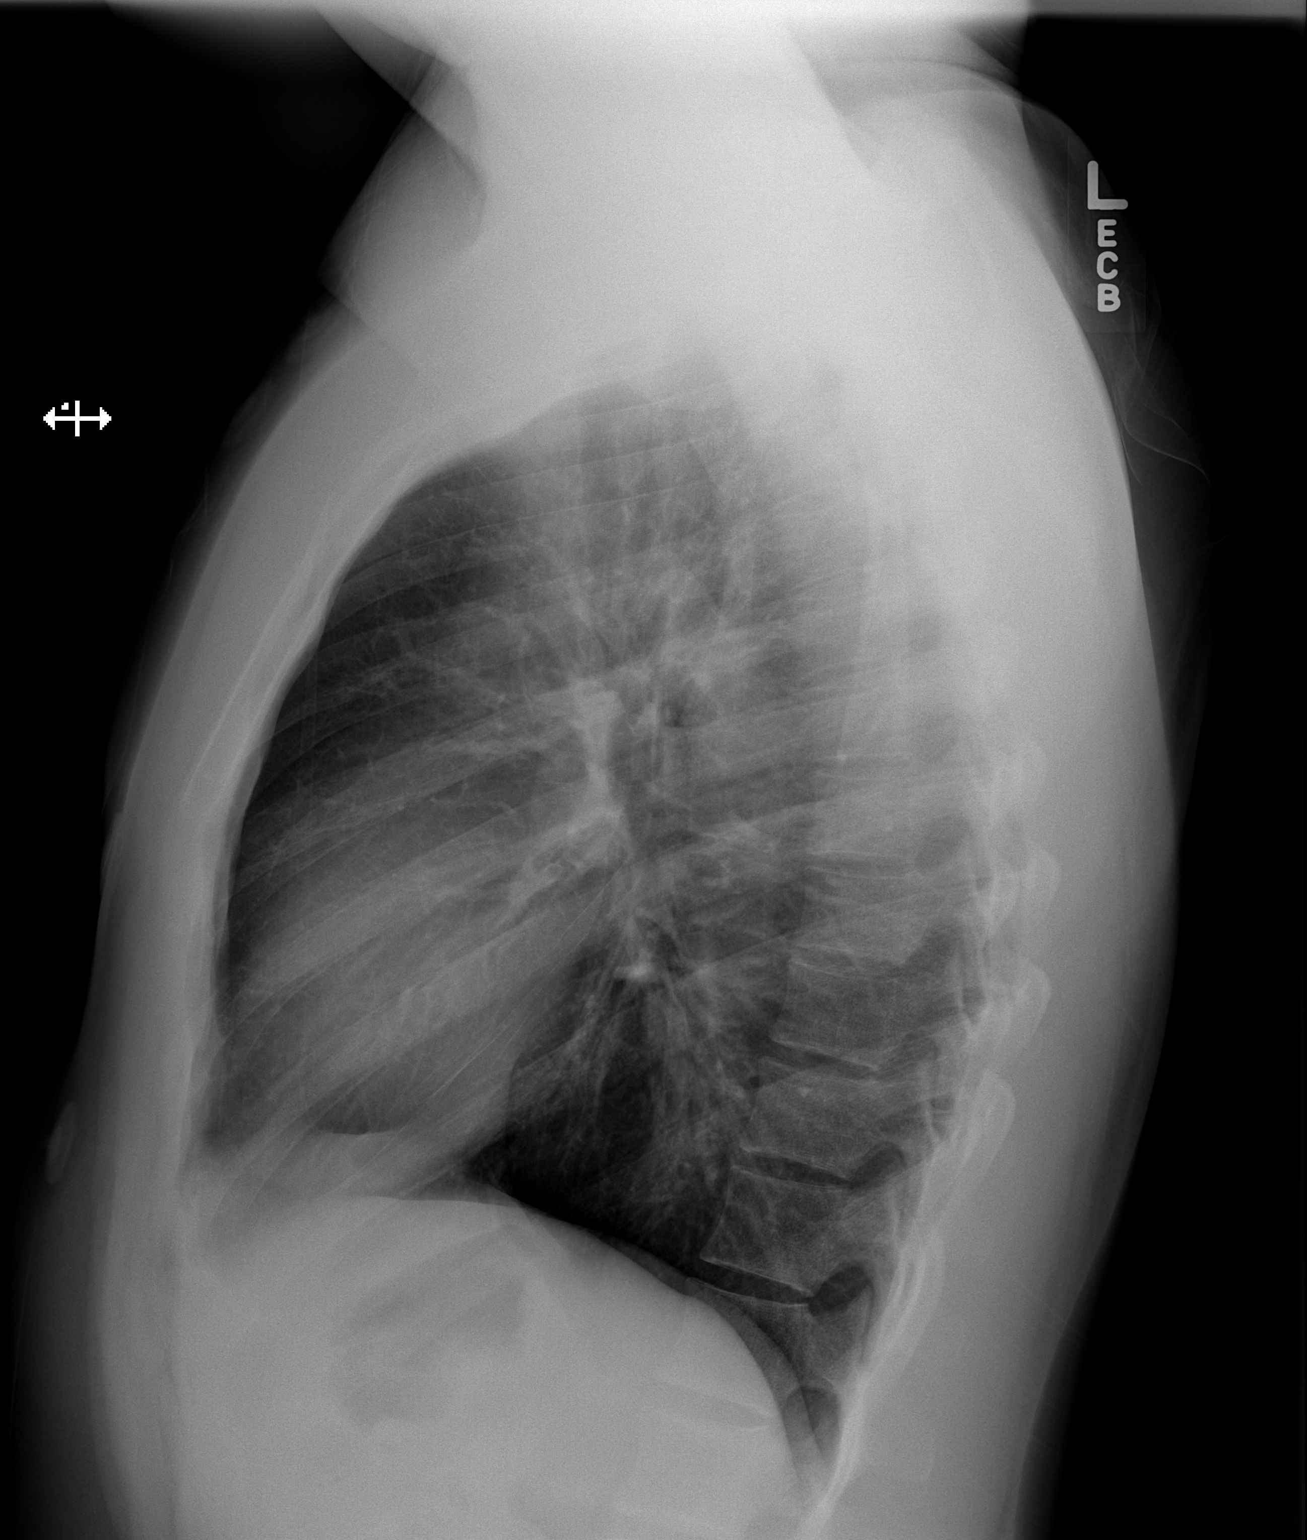

[2 of 2 positions shown; findings below may reference images not displayed]

FINDINGS: Stable heart size and mediastinal contours are within normal limits.
Both lungs are clear. The visualized skeletal structures are
unremarkable.
IMPRESSION: No active cardiopulmonary disease.

By: Manonko Berikimas M.D.

## 2021-05-05 ENCOUNTER — Inpatient Hospital Stay
Admit: 2021-05-05 | Discharge: 2021-05-05 | Disposition: A | Discharge status: Home / Self Care | Payer: PRIVATE HEALTH INSURANCE | Attending: Emergency Medicine

## 2021-05-05 DIAGNOSIS — J4530 Mild persistent asthma, uncomplicated: Secondary | ICD-10-CM

## 2021-05-05 MED ORDER — BUDESONIDE-FORMOTEROL HFA 80 MCG-4.5 MCG/ACTUATION AEROSOL INHALER
Freq: Two times a day (BID) | RESPIRATORY_TRACT | 0 refills | Status: AC
Start: 2021-05-05 — End: 2021-07-04

## 2021-05-05 MED ORDER — ALBUTEROL SULFATE HFA 90 MCG/ACTUATION AEROSOL INHALER
90 mcg/actuation | RESPIRATORY_TRACT | 0 refills | Status: AC | PRN
Start: 2021-05-05 — End: ?

## 2021-05-05 MED ORDER — INHALATIONAL SPACING DEVICE
0 refills | Status: AC | PRN
Start: 2021-05-05 — End: ?

## 2021-05-05 NOTE — ED Notes (Signed)
Pt presents to ED wanting refill for Symbicort.

## 2021-05-05 NOTE — ED Provider Notes (Signed)
EMERGENCY DEPARTMENT HISTORY AND PHYSICAL EXAM      Date: 05/05/2021  Patient Name: Alvin Zimmerman    History of Presenting Illness     Chief Complaint   Patient presents with    Medication Refill       History Provided By: Patient    Chief Complaint: Asthma medication refill    Additional History (Context): [name] is a 25 y.o. male who presents with request for refill of his Symbicort and albuterol.  Has asthma, is not currently having an exacerbation, however has run out of his Symbicort and needs a refill.  Denies any recent illness, fevers, chills, sweats, cough, sputum.    PCP: None    Current Outpatient Medications   Medication Sig Dispense Refill    budesonide-formoteroL (SYMBICORT) 80-4.5 mcg/actuation HFAA Take 2 Puffs by inhalation two (2) times a day for 60 days. 4 Each 0    albuterol (PROVENTIL HFA, VENTOLIN HFA, PROAIR HFA) 90 mcg/actuation inhaler Take 2 Puffs by inhalation every four (4) hours as needed for Wheezing, Shortness of Breath or Cough. 18 g 0    inhalational spacing device 1 Each by Does Not Apply route as needed for Wheezing. 1 Each 0       Past History     Past Medical History:  No past medical history on file.  Asthma  Past Surgical History:  No past surgical history on file.    Family History:  No family history on file.  Reviewed and noncontributory  Social History:  Denies alcohol or illicit drug abuse    Allergies:  No Known Allergies      Review of Systems   Review of Systems   Constitutional:  Negative for chills, fatigue and fever.   HENT:  Negative for congestion, rhinorrhea, sore throat and trouble swallowing.    Eyes:  Negative for discharge, redness and itching.   Respiratory:  Negative for cough, shortness of breath, wheezing and stridor.    Cardiovascular:  Negative for chest pain, palpitations and leg swelling.   Gastrointestinal:  Negative for abdominal pain, blood in stool, diarrhea, nausea and vomiting.   Genitourinary:  Negative for difficulty urinating and  dysuria.   Musculoskeletal:  Negative for back pain.   Skin:  Negative for rash.   Neurological:  Negative for syncope and light-headedness.   Psychiatric/Behavioral:  Negative for behavioral problems and confusion.    All other systems reviewed and are negative.    Physical Exam   There were no vitals filed for this visit.  Physical Exam  Vitals and nursing note reviewed.   Constitutional:       General: He is not in acute distress.     Appearance: He is well-developed and normal weight. He is not ill-appearing.   HENT:      Head: Normocephalic and atraumatic.      Nose: Nose normal.      Mouth/Throat:      Mouth: Mucous membranes are moist.      Pharynx: Oropharynx is clear.   Eyes:      Extraocular Movements: Extraocular movements intact.      Conjunctiva/sclera: Conjunctivae normal.      Pupils: Pupils are equal, round, and reactive to light.   Cardiovascular:      Rate and Rhythm: Normal rate and regular rhythm.      Heart sounds: Normal heart sounds. No murmur heard.  Pulmonary:      Effort: Pulmonary effort is normal.  Breath sounds: Normal breath sounds. No wheezing.   Abdominal:      General: Bowel sounds are normal.      Palpations: Abdomen is soft.      Tenderness: There is no abdominal tenderness.   Musculoskeletal:         General: No tenderness. Normal range of motion.      Cervical back: Normal range of motion and neck supple.      Right lower leg: No edema.      Left lower leg: No edema.   Skin:     General: Skin is warm and dry.      Capillary Refill: Capillary refill takes less than 2 seconds.      Findings: No rash.   Neurological:      General: No focal deficit present.      Mental Status: He is alert and oriented to person, place, and time.   Psychiatric:         Behavior: Behavior normal.         Diagnostic Study Results     Labs -   No results found for this or any previous visit (from the past 12 hour(s)).    Radiologic Studies -   No orders to display     CT Results  (Last 48 hours)       None          CXR Results  (Last 48 hours)      None              Medical Decision Making   I am the first provider for this patient.    I reviewed the vital signs, available nursing notes, past medical history, past surgical history, family history and social history.    Vital Signs-Reviewed the patient's vital signs.    Records Reviewed: Nursing Notes and Old Medical Records    ED Course:      Remained stable during his stay    Disposition:  [facility] Stallion    DISCHARGE NOTE:     Pt has been reexamined. Patient has no new complaints, changes, or physical findings.  Care plan outlined and precautions discussed.  All medications were reviewed with the patient; will d/c home with refill of his Symbicort and albuterol. All of pt's questions and concerns were addressed. Patient was instructed and agrees to follow up with his primary care provider, as well as to return to the ED upon further deterioration. Patient is ready to go home.    Follow-up Information       Follow up With Specialties Details Why Contact Info    [facility]  Schedule an appointment as soon as possible for a visit in 1 week  [address] [place] [address]  [phone]    [facility] EMERGENCY DEPT Emergency Medicine  As needed, If symptoms worsen [address] [place] [address]  [phone]            Current Discharge Medication List        START taking these medications    Details   budesonide-formoteroL (SYMBICORT) 80-4.5 mcg/actuation HFAA Take 2 Puffs by inhalation two (2) times a day for 60 days.  Qty: 4 Each, Refills: 0  Start date: 05/05/2021, End date: 07/04/2021      albuterol (PROVENTIL HFA, VENTOLIN HFA, PROAIR HFA) 90 mcg/actuation inhaler Take 2 Puffs by inhalation every four (4) hours as needed for Wheezing, Shortness of Breath or Cough.  Qty: 18 g, Refills: 0  Start date: 05/05/2021      inhalational spacing device 1 Each by Does Not Apply route as needed for Wheezing.  Qty: 1 Each, Refills:  0  Start date: 05/05/2021             Provider Notes (Medical Decision Making):   25 year old gentleman who needs a refill of his Symbicort and albuterol, which were provided.  He has no insurance, so was referred to the [facility].  No current evidence of asthma exacerbation.    Diagnosis     Clinical Impression:   1. Mild persistent asthma, unspecified whether complicated    2. Medication refill

## 2021-05-05 NOTE — ED Notes (Signed)
Patient seen and discharged by the provider

## 2021-12-30 ENCOUNTER — Inpatient Hospital Stay
Admit: 2021-12-30 | Discharge: 2021-12-30 | Disposition: A | Discharge status: Home / Self Care | Payer: PRIVATE HEALTH INSURANCE | Attending: Student in an Organized Health Care Education/Training Program

## 2021-12-30 DIAGNOSIS — J019 Acute sinusitis, unspecified: Secondary | ICD-10-CM

## 2021-12-30 DIAGNOSIS — J329 Chronic sinusitis, unspecified: Secondary | ICD-10-CM

## 2021-12-30 MED ORDER — DOXYCYCLINE HYCLATE 100 MG PO TABS
100 MG | ORAL_TABLET | Freq: Two times a day (BID) | ORAL | 0 refills | Status: AC
Start: 2021-12-30 — End: 2022-01-06

## 2021-12-30 NOTE — Discharge Instructions (Signed)
Alternate tylenol and motrin for pain and fever    Emergency Department for worsening symptoms including chest pain shortness of breath inability to breath    Over the counter medications for symptomatic relief including mucinex dm, flonase, sudafed. If you are unable to use sudafed  due to high blood pressure diagnosis use zyrtec as package instructed.

## 2021-12-30 NOTE — ED Notes (Signed)
Patient discharged by provider.      Thomos Lemons, RN  12/30/21 [phone]

## 2021-12-30 NOTE — ED Triage Notes (Signed)
Pt arrives amb through triage for sinus infection/allergies since 11/13/2021

## 2021-12-30 NOTE — ED Provider Notes (Addendum)
[facility]  Emergency Department Treatment Report    Patient: Alvin Zimmerman Age: 26 y.o. Sex: male    Date of Birth: 1996-05-23 Admit Date: 12/30/2021 PCP: None None   MRN: [id number]  CSN: [id number]  Attending: Ardyth Harps, DO   Room: [phone] Time Dictated: 7:48 PM APP: Shireen Quan, PA-C       Supervising Physician Attestation      I have discussed this case with the advanced practice provider. We have reviewed the presentation, pertinent historical and physical exam findings, as well as relevant  findings. I have been available at all times and agree with the management and disposition documented here.     Johny Shock, DO  Dec 30, 2021      Chief Complaint   Chief Complaint   Patient presents with    Sinusitis        History of Present Illness     26 y.o. male without significant past medical history presents to the emergency department with a 6-week worsening history of nasal congestion and yellow thick nasal discharge.  He denies fever or purulent discharge.  Patient with some concerns that he either has seasonal allergies or now has a sinus infection.  He has not attempted any palliation prior to arrival.  Patient states he had been working in [place] and just had not found the time to take any medicine.  Denies fevers or chills.    Review of Systems     Constitutional: No fever, chills, or weight loss  Eyes: No visual symptoms.  ENT: Nasal congestion, sore throat runny nose   respiratory: No cough, dyspnea or wheezing.  Cardiovascular: No chest pain, pressure, palpitations, tightness or heaviness.  Gastrointestinal: No vomiting, diarrhea or abdominal pain.  Genitourinary: No dysuria, frequency, or urgency.  Musculoskeletal: No joint pain or swelling.  Integumentary: No rashes.  Neurological: No headaches, sensory or motor symptoms.  Denies complaints in all other systems.      Past Medical/Surgical History     No past medical history on file.  No past surgical history on  file.      Social History     Social History     Socioeconomic History    Marital status: Single       Family History     No family history on file.    Current Medications     Previous Medications    ALBUTEROL SULFATE HFA (PROVENTIL;VENTOLIN;PROAIR) 108 (90 BASE) MCG/ACT INHALER    Inhale 2 puffs into the lungs every 4 hours as needed         Allergies     No Known Allergies    Physical Exam     ED Triage Vitals [12/30/21 1841]   Enc Vitals Group      BP 138/72      Pulse (!) 112      Respirations 16      Temp 98.6 F (37 C)      Temp Source Oral      SpO2 99 %      Weight - Scale 93 kg      Height 1.778 m       Constitutional: Patient appears well developed and well nourished.  Appearance and behavior are age and situation appropriate.  HEENT: Conjunctiva clear.  PERRL. Bilateral TM non edematous or erythematous. Mucous membranes moist, non-erythematous. Surface of the pharynx, palate, and tongue are pink, moist and without lesions.  Neck: supple, non tender, symmetrical,  no masses or JVD.   Respiratory: lungs clear to auscultation, nonlabored respirations. No tachypnea or accessory muscle use.  Cardiovascular: heart regular rate and rhythm without murmur rubs or gallops.   Musculoskeletal: Back/Spine: No stepoffs, no tenderness to palpation midline, , no lacerations/abrasions/ecchymosis  Ext: Warm/well perfused, DP/PT/radial pulses palpable bilaterally, no bony tenderness, no obvious deformities or lesions.  Integumentary: warm and dry without rashes or lesions  Neurologic: alert and oriented,       Impression and Management Plan     26 year old male who presents emergency department with complaints of 6-week worsening history of nasal congestion alternating between runny nose facial pain and pressure.  Differential does include chronic sinusitis, acute sinusitis, viral upper respiratory infection.  Due to the longevity of patient's symptoms I will treat for a sinus situs with instructions to follow-up with  primary care physician use over-the-counter nasal decongestions.          This patient was evaluated in the emergency department for symptoms described in the history of present illness.  They were evaluated in the context of global COVID-19 pandemic, which necessitated consideration of the patient may be at  risk for infection with the SARS-COV-2 virus that causes COVID-19.  Institutional protocols and algorithms that pertain to the evaluation of patients at risk for COVID-19 are in a state of rapid change based on the information released by regulatory bodies including the CDC and federal state organizations.  These policies and algorithms were followed during the patient's care in the emergency department.          Diagnostic Studies     Lab:   No results found for this or any previous visit (from the past 12 hour(s)).    Imaging:    No orders to display             ED Course/ Medical Decision Making      Medications - No data to display    Patient remained stable in the emergency department.  Without requiring further intervention.        RECORDS REVIEWED:  I reviewed the patient's previous records here at [facility] and available outside facilities and note that patient evaluated in this emergency department July 15, 2021 for dental pain    EXTERNAL RESULTS REVIEWED: None    INDEPENDENT HISTORIAN:  History and/or plan development assisted by: Patient    Severe exacerbation or progression of chronic illness: None      Threat to body function without evaluation and management: Respiratory      SOCIAL DETERMINANTS  impacting Evaluation and Management: Patient is a smoker      Comorbidities impacting Evaluation and Management: None            I am the first provider for this patient.     I reviewed the vital signs, available nursing notes, past medical history, past surgical history, family history and social history.      I have spoken to Ardyth Harps, DO regarding this patient's care and we discussed history,  physical exam, ER course disposition plan and treatment        I considered prescribing doxycycline  Final Diagnosis     1. Acute non-recurrent sinusitis, unspecified location        Disposition          Medication List        ASK your doctor about these medications      albuterol sulfate HFA 108 (90 Base) MCG/ACT inhaler  Commonly known as: PROVENTIL;VENTOLIN;PROAIR                 Patient discharged stable to home.  Follow-up with primary care physician.  Return to the ED if symptom persist or worsen.    Rita Ohara  Dec 30, 2021        The patient was personally evaluated by myself and Ardyth Harps, DO who agrees with the above assessment and plan.    My signature above authenticates this document and my orders, the final    diagnosis (es), discharge prescription (s), and instructions in the Epic    record.  If you have any questions please contact [phone].     Nursing notes have been reviewed by the physician/ advanced practice    Clinician.  Dragon medical dictation software was used for portions of this report. Unintended voice recognition errors may occur.                       Shireen Quan, PA  12/30/21 1950       Ardyth Harps, DO  01/11/22 1730
